# Patient Record
Sex: Male | Born: 1965 | Race: White | Hispanic: No | Marital: Married | Smoking: Current every day smoker
Health system: Southern US, Community
[De-identification: ages and names within clinical notes are randomized; demographics above are authoritative.]

## PROBLEM LIST (undated history)

## (undated) DIAGNOSIS — E119 Type 2 diabetes mellitus without complications: Secondary | ICD-10-CM

## (undated) DIAGNOSIS — G629 Polyneuropathy, unspecified: Secondary | ICD-10-CM

## (undated) DIAGNOSIS — E785 Hyperlipidemia, unspecified: Secondary | ICD-10-CM

## (undated) HISTORY — DX: Polyneuropathy, unspecified: G62.9

## (undated) HISTORY — DX: Type 2 diabetes mellitus without complications: E11.9

## (undated) HISTORY — DX: Hyperlipidemia, unspecified: E78.5

---

## 2017-11-24 ENCOUNTER — Encounter: Payer: Self-pay | Admitting: Emergency Medicine

## 2017-11-24 ENCOUNTER — Emergency Department: Payer: BLUE CROSS/BLUE SHIELD

## 2017-11-24 ENCOUNTER — Other Ambulatory Visit: Payer: Self-pay

## 2017-11-24 DIAGNOSIS — X58XXXA Exposure to other specified factors, initial encounter: Secondary | ICD-10-CM | POA: Diagnosis not present

## 2017-11-24 DIAGNOSIS — M722 Plantar fascial fibromatosis: Secondary | ICD-10-CM | POA: Diagnosis not present

## 2017-11-24 DIAGNOSIS — S93602A Unspecified sprain of left foot, initial encounter: Secondary | ICD-10-CM | POA: Diagnosis not present

## 2017-11-24 DIAGNOSIS — S99922A Unspecified injury of left foot, initial encounter: Secondary | ICD-10-CM | POA: Diagnosis present

## 2017-11-24 DIAGNOSIS — F17200 Nicotine dependence, unspecified, uncomplicated: Secondary | ICD-10-CM | POA: Diagnosis not present

## 2017-11-24 DIAGNOSIS — Y929 Unspecified place or not applicable: Secondary | ICD-10-CM | POA: Insufficient documentation

## 2017-11-24 DIAGNOSIS — Y9389 Activity, other specified: Secondary | ICD-10-CM | POA: Diagnosis not present

## 2017-11-24 DIAGNOSIS — Y990 Civilian activity done for income or pay: Secondary | ICD-10-CM | POA: Diagnosis not present

## 2017-11-24 NOTE — ED Triage Notes (Signed)
Pt to triage via w/c with no distress noted; pt reports left foot slipped this evening and felt "snap" in arch"; cont to have pain

## 2017-11-25 ENCOUNTER — Emergency Department
Admission: EM | Admit: 2017-11-25 | Discharge: 2017-11-25 | Disposition: A | Discharge status: Home / Self Care | Payer: BLUE CROSS/BLUE SHIELD | Attending: Emergency Medicine | Admitting: Emergency Medicine

## 2017-11-25 DIAGNOSIS — M722 Plantar fascial fibromatosis: Secondary | ICD-10-CM

## 2017-11-25 DIAGNOSIS — S93602A Unspecified sprain of left foot, initial encounter: Secondary | ICD-10-CM

## 2017-11-25 MED ORDER — KETOROLAC TROMETHAMINE 60 MG/2ML IM SOLN
60.0000 mg | Freq: Once | INTRAMUSCULAR | Status: AC
  Administered 2017-11-25: 60 mg via INTRAMUSCULAR
  Filled 2017-11-25: qty 2

## 2017-11-25 MED ORDER — ETODOLAC 200 MG PO CAPS: 200.0000 mg | ORAL_CAPSULE | Freq: Three times a day (TID) | ORAL | 0 refills | Status: AC

## 2017-11-25 MED ORDER — TRAMADOL HCL 50 MG PO TABS
50.0000 mg | ORAL_TABLET | Freq: Once | ORAL | Status: AC
  Administered 2017-11-25: 50 mg via ORAL
  Filled 2017-11-25: qty 1

## 2017-11-25 MED ORDER — TRAMADOL HCL 50 MG PO TABS: 50.0000 mg | ORAL_TABLET | Freq: Four times a day (QID) | ORAL | 0 refills | Status: DC | PRN

## 2017-11-25 NOTE — ED Provider Notes (Signed)
[facility] Emergency Department Provider Note   ____________________________________________   First MD Initiated Contact with Patient 11/25/17 [phone]     (approximate)  I have reviewed the triage vital signs and the nursing notes.   HISTORY  Chief Complaint Foot Pain    HPI Devin Morse is a 52 y.o. male who comes into the hospital today with left foot pain.  For the past 3 weeks he thought maybe he had a heel spur.  While he was at work today he put his foot on a step and he felt some tearing to the arch of his foot.  He is concerned that he may have torn tendons.  His foot is throbbing and he does have pain in his heel.  He is unable to put pressure on his foot.  The patient did not take any medicine or ice his foot before he came.  He did put some Biofreeze on it.  The patient rates his pain a 10 out of 10 in intensity.  This occurred around 6 PM at work.  He is here today for evaluation.   History reviewed. No pertinent past medical history.  There are no active problems to display for this patient.   History reviewed. No pertinent surgical history.  Prior to Admission medications   Medication Sig Start Date End Date Taking? Authorizing Provider  etodolac (LODINE) 200 MG capsule Take 1 capsule (200 mg total) by mouth every 8 (eight) hours. 11/25/17   Rebecka ApleyWebster, Alam Guterrez P, MD  traMADol (ULTRAM) 50 MG tablet Take 1 tablet (50 mg total) by mouth every 6 (six) hours as needed. 11/25/17   Rebecka ApleyWebster, Sheryl Towell P, MD    Allergies Penicillins  No family history on file.  Social History Social History   Tobacco Use  . Smoking status: Current Every Day Smoker  . Smokeless tobacco: Never Used  Substance Use Topics  . Alcohol use: Not on file  . Drug use: Not on file    Review of Systems  Constitutional: No fever/chills Eyes: No visual changes. ENT: No sore throat. Cardiovascular: Denies chest pain. Respiratory: Denies shortness of  breath. Gastrointestinal: No abdominal pain.  No nausea, no vomiting.  No diarrhea.  No constipation. Genitourinary: Negative for dysuria. Musculoskeletal: Left foot pain Skin: Negative for rash. Neurological: Negative for headaches, focal weakness or numbness.   ____________________________________________   PHYSICAL EXAM:  VITAL SIGNS: ED Triage Vitals [11/24/17 2336]  Enc Vitals Group     BP (!) 148/82     Pulse Rate (!) 110     Resp 18     Temp 97.8 F (36.6 C)     Temp src      SpO2 100 %     Weight 255 lb (115.7 kg)     Height 5\' 8"  (1.727 m)     Head Circumference      Peak Flow      Pain Score 10     Pain Loc      Pain Edu?      Excl. in GC?     Constitutional: Alert and oriented. Well appearing and in moderate distress. Eyes: Conjunctivae are normal. PERRL. EOMI. Head: Atraumatic. Nose: No congestion/rhinnorhea. Mouth/Throat: Mucous membranes are moist.  Oropharynx non-erythematous. Cardiovascular: Normal rate, regular rhythm. Grossly normal heart sounds.  Good peripheral circulation. Respiratory: Normal respiratory effort.  No retractions. Lungs CTAB. Gastrointestinal: Soft and nontender. No distention.  Positive bowel sounds Musculoskeletal: Tenderness to palpation of left foot and the heel  instep and balls of his foot.  There is some mild soft tissue swelling and pain with dorsiflexion of his foot.  There is no bruising. Neurologic:  Normal speech and language.  Skin:  Skin is warm, dry and intact.  Psychiatric: Mood and affect are normal.   ____________________________________________   LABS (all labs ordered are listed, but only abnormal results are displayed)  Labs Reviewed - No data to display ____________________________________________  EKG  none ____________________________________________  RADIOLOGY  ED MD interpretation: Left foot x-ray: No acute fracture or joint dislocation identified, calcaneal entheseopathy.  Official radiology  report(s): Dg Foot Complete Left  Result Date: 11/24/2017 CLINICAL DATA:  Left foot slipped this evening and felt a snap in the arch. Pain. EXAM: LEFT FOOT - COMPLETE 3+ VIEW COMPARISON:  None. FINDINGS: There is no evidence of fracture or dislocation. The Lisfranc articulation appears congruent without abnormal widening. Calcaneal enthesophytes are seen along the plantar dorsal aspect. There is no evidence of arthropathy or other focal bone abnormality. Soft tissues are unremarkable. IMPRESSION: No acute fracture or joint dislocation is identified. Calcaneal enthesopathy. Should symptoms persist without improvement, consider cross-sectional imaging or follow-up radiographs in 7-10 days to identify a radiographically occult fracture. Electronically Signed   By: Tollie Ethavid  Kwon M.D.   On: 11/24/2017 23:59    ____________________________________________   PROCEDURES  Procedure(s) performed: None  Procedures  Critical Care performed: No  ____________________________________________   INITIAL IMPRESSION / ASSESSMENT AND PLAN / ED COURSE  As part of my medical decision making, I reviewed the following data within the electronic MEDICAL RECORD NUMBER Notes from prior ED visits and [place] Controlled Substance Database   This is a 52 year old male who comes into the hospital today with some left foot pain after feeling some tearing stepping on it at work.  We did send the patient for an x-ray which did not show any fractures.  We did place the patient in a splint.  His sensation in color and motion was intact after the splint was placed.  The patient received a shot of Toradol and some tramadol.  He is improved.  He will be discharged home.      ____________________________________________   FINAL CLINICAL IMPRESSION(S) / ED DIAGNOSES  Final diagnoses:  Sprain of left foot, initial encounter  Plantar fasciitis of left foot     ED Discharge Orders         Ordered    etodolac (LODINE) 200 MG  capsule  Every 8 hours     11/25/17 0203    traMADol (ULTRAM) 50 MG tablet  Every 6 hours PRN     11/25/17 0203           Note:  This document was prepared using Dragon voice recognition software and may include unintentional dictation errors.    Rebecka ApleyWebster, Money Mckeithan P, MD 11/25/17 [phone]

## 2017-11-25 NOTE — Discharge Instructions (Addendum)
Please follow up with orthopedic surgery for further evaluation of your foot pain.

## 2018-01-12 ENCOUNTER — Encounter: Payer: Self-pay | Admitting: Family Medicine

## 2018-01-12 ENCOUNTER — Ambulatory Visit: Payer: Self-pay | Admitting: Family Medicine

## 2018-01-12 VITALS — BP 128/72 | HR 96 | Temp 98.2°F | Resp 18 | Ht 68.0 in | Wt 274.6 lb

## 2018-01-12 DIAGNOSIS — E785 Hyperlipidemia, unspecified: Secondary | ICD-10-CM

## 2018-01-12 DIAGNOSIS — Z794 Long term (current) use of insulin: Secondary | ICD-10-CM

## 2018-01-12 DIAGNOSIS — E11628 Type 2 diabetes mellitus with other skin complications: Secondary | ICD-10-CM

## 2018-01-12 DIAGNOSIS — Z6841 Body Mass Index (BMI) 40.0 and over, adult: Secondary | ICD-10-CM

## 2018-01-12 DIAGNOSIS — E1142 Type 2 diabetes mellitus with diabetic polyneuropathy: Secondary | ICD-10-CM

## 2018-01-12 DIAGNOSIS — E119 Type 2 diabetes mellitus without complications: Secondary | ICD-10-CM | POA: Insufficient documentation

## 2018-01-12 DIAGNOSIS — Z1211 Encounter for screening for malignant neoplasm of colon: Secondary | ICD-10-CM

## 2018-01-12 DIAGNOSIS — Z1212 Encounter for screening for malignant neoplasm of rectum: Secondary | ICD-10-CM

## 2018-01-12 DIAGNOSIS — N493 Fournier gangrene: Secondary | ICD-10-CM

## 2018-01-12 DIAGNOSIS — M722 Plantar fascial fibromatosis: Secondary | ICD-10-CM | POA: Insufficient documentation

## 2018-01-12 MED ORDER — GABAPENTIN 300 MG PO CAPS: ORAL_CAPSULE | ORAL | 0 refills | Status: DC

## 2018-01-12 MED ORDER — SULFAMETHOXAZOLE-TRIMETHOPRIM 800-160 MG PO TABS: 1.0000 | ORAL_TABLET | Freq: Two times a day (BID) | ORAL | 0 refills | Status: AC

## 2018-01-12 MED ORDER — METFORMIN HCL ER 750 MG PO TB24: 1500.0000 mg | ORAL_TABLET | Freq: Every day | ORAL | 1 refills | Status: DC

## 2018-01-12 MED ORDER — ATORVASTATIN CALCIUM 40 MG PO TABS: 40.0000 mg | ORAL_TABLET | Freq: Every day | ORAL | 1 refills | Status: DC

## 2018-01-12 NOTE — Progress Notes (Signed)
Name: Devin Morse   MRN: [id number]    DOB: 09-06-1965   Date:01/12/2018       Progress Note  Subjective  Chief Complaint  Chief Complaint  Patient presents with  . Establish Care  . Diabetes  . Foot Injury    and pain from diabetic neuropathy and previous foot injury  . Back Pain    low back pain    HPI  PT presents to establish care and for the following:  Diabetes mellitus type 2 Checking sugars?  yes How often? Twice daily - fasting in the morning (150-200's), and when he gets home from work (270-300's) Does patient feel additional teaching/training would be helpful?  no  Have they attended Diabetes education classes? no  Trying to limit white bread, white rice, white potatoes, sweets?  no Trying to limit sweetened drinks like iced tea, soft drinks, sports drinks, fruit juices?  Drinks some sodas throughout the week (maybe 5 a week), drinks a lot of water. Checking feet every day/night?  yes; has neuropathy to bilateral feet - taking gabapentin but has been out for well over a month. Last eye exam:  May 2019 - awaiting records Denies: Polyuria, polydipsia, polyphagia, vision changes. Most recent A1C: No results found for: HGBA1C  We will recheck today. Last CMP Results : is due for repeat today No results found for: NA, K, CL, CO2, GLUCOSE, BUN, CREATININE, CALCIUM, PROT, ALBUMIN, AST, ALT, ALKPHOS, BILITOT, GFRNONAA, GFRAA Urine Micro UTD? No   He has never seen endocrinology.  Current Medication Regimen: Novolin N - 25 units twice daily Novolin R - 12 units twice daily. Metformin 1000mg  BID - has been out for several weeks ACEI/ARB: No Statin: Yes Aspirin therapy: No  History of Fournier's Gangrene with current skin breakdown: Had emergency surgery 2 years ago due to gangrene, has had ongoing issues with recurrent skin infections.  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  Typically takes antibiotic when new boils occur, and notes 2 new spots  recently - would like to have anitbiotic if warranted today.  Denies fatigue; fevers/chills, no drainage from the area.  Endorses redness and tenderness.   Chronic low back pain - Has been taking 1000mg  Naproxen BID.  He states this is "the only way I'm able to get out of bed and move".  He notes history of lower back "compression".  He notes that he does not have time to go to a lot of doctor's appointments because his job is too busy.  Discussed pain management referral, but he declines today.  We will check labs today, and follow up in 2 weeks to discuss possible imaging.  LEFT foot injury -he notes injury to his LEFT foot.  He was seen by [facility] ER - given Tramadol, was put in a boot and was told to return in 30 days but never returned. Was told he had sprained foot and plantar fasciitis.  There are no active problems to display for this patient.   No past surgical history on file.  No family history on file.  Social History   Socioeconomic History  . Marital status: Married    Spouse name: Montford Barg  . Number of children: 1  . Years of education: Not on file  . Highest education level: Not on file  Occupational History  . Not on file  Social Needs  . Financial resource strain: Not hard at all  . Food insecurity:    Worry: Never  true    Inability: Never true  . Transportation needs:    Medical: No    Non-medical: No  Tobacco Use  . Smoking status: Current Every Day Smoker  . Smokeless tobacco: Never Used  Substance and Sexual Activity  . Alcohol use: Never    Frequency: Never  . Drug use: Never  . Sexual activity: Yes  Lifestyle  . Physical activity:    Days per week: 0 days    Minutes per session: 0 min  . Stress: Not at all  Relationships  . Social connections:    Talks on phone: More than three times a week    Gets together: More than three times a week    Attends religious service: Never    Active member of club or organization: No    Attends meetings of  clubs or organizations: Never    Relationship status: Married  . Intimate partner violence:    Fear of current or ex partner: No    Emotionally abused: No    Physically abused: No    Forced sexual activity: No  Other Topics Concern  . Not on file  Social History Narrative  . Not on file     Current Outpatient Medications:  .  atorvastatin (LIPITOR) 40 MG tablet, Take 40 mg by mouth daily., Disp: , Rfl:  .  gabapentin (NEURONTIN) 300 MG capsule, Take 300 mg by mouth 3 (three) times daily., Disp: , Rfl:  .  insulin NPH Human (HUMULIN N,NOVOLIN N) 100 UNIT/ML injection, Inject 25 Units into the skin daily before breakfast., Disp: , Rfl:  .  insulin regular (NOVOLIN R,HUMULIN R) 100 units/mL injection, Inject 12 Units into the skin 2 (two) times daily before a meal., Disp: , Rfl:  .  metFORMIN (GLUCOPHAGE) 1000 MG tablet, Take 1,000 mg by mouth 2 (two) times daily with a meal., Disp: , Rfl:  .  etodolac (LODINE) 200 MG capsule, Take 1 capsule (200 mg total) by mouth every 8 (eight) hours. (Patient not taking: Reported on 01/12/2018), Disp: 12 capsule, Rfl: 0 .  traMADol (ULTRAM) 50 MG tablet, Take 1 tablet (50 mg total) by mouth every 6 (six) hours as needed. (Patient not taking: Reported on 01/12/2018), Disp: 12 tablet, Rfl: 0  Allergies  Allergen Reactions  . Penicillins     I personally reviewed active problem list, medication list, allergies, family history, social history, health maintenance, imaging with the patient/caregiver today.   ROS  Constitutional: Negative for fever or weight change.  Respiratory: Negative for cough and shortness of breath.   Cardiovascular: Negative for chest pain or palpitations.  Gastrointestinal: Negative for abdominal pain, no bowel changes.  Musculoskeletal: Negative for gait problem or joint swelling.  Skin: Negative for rash.  Neurological: Negative for dizziness or headache.  No other specific complaints in a complete review of systems (except  as listed in HPI above).  Objective  Vitals:   01/12/18 1316  BP: 128/72  Pulse: 96  Resp: 18  Temp: 98.2 F (36.8 C)  TempSrc: Oral  SpO2: 99%  Weight: 274 lb 9.6 oz (124.6 kg)  Height: 5\' 8"  (1.727 m)   Body mass index is 41.75 kg/m.  Physical Exam  Constitutional: Patient appears well-developed and well-nourished. No distress.  HENT: Head: Normocephalic and atraumatic. Neck: Normal range of motion. Neck supple. No JVD present. No thyromegaly present.  Cardiovascular: Normal rate, regular rhythm and normal heart sounds.  No murmur heard. No BLE edema. Pulmonary/Chest: Effort normal and breath  sounds normal. No respiratory distress. Abdominal: Soft. Bowel sounds are normal, no distension. There is no tenderness. No masses. Musculoskeletal: Normal range of motion, no joint effusions. No gross deformities Neurological: Pt is alert and oriented to person, place, and time. No cranial nerve deficit. Coordination, balance, strength, speech and gait are normal.  Skin: Skin is warm and dry. No rash noted. No erythema. Perianal skin and LEFT buttock have significant scarring, and skin in the perineum is quite taught with a small area that appears to be a tear that is mildly erythematous without exudate, but is tender to palpation.  LEFT buttock has significant deformity - states this is baseline s/p gangrene removal. Psychiatric: Patient has a normal mood and affect. behavior is normal. Judgment and thought content normal.  No results found for this or any previous visit (from the past 72 hour(s)).  PHQ2/9: Depression screen PHQ 2/9 01/12/2018  Decreased Interest 0  Down, Depressed, Hopeless 0  PHQ - 2 Score 0  Altered sleeping 0  Tired, decreased energy 0  Change in appetite 0  Feeling bad or failure about yourself  0  Trouble concentrating 0  Moving slowly or fidgety/restless 0  Suicidal thoughts 0  PHQ-9 Score 0  Difficult doing work/chores Not difficult at all    Fall  Risk: Fall Risk  01/12/2018  Falls in the past year? No   Assessment & Plan  1. Type 2 diabetes mellitus with other skin complication, with long-term current use of insulin (HCC) - We will obtain labs as we have no baseline from which to adjust his medications.  Discussed diabetic diet in detail. - metFORMIN (GLUCOPHAGE XR) 750 MG 24 hr tablet; Take 2 tablets (1,500 mg total) by mouth daily with breakfast.  Dispense: 180 tablet; Refill: 1 - CBC w/Diff/Platelet - COMPLETE METABOLIC PANEL WITH GFR - Urine Microalbumin w/creat. ratio - Hemoglobin A1c  2. Fournier's gangrene of scrotum - Pt appears to have small area of cellulitis - we will treat today for this, and continue to monitor the area.  He has seen plastics several times in the past about the scar tissue, and declines referral today. - sulfamethoxazole-trimethoprim (BACTRIM DS,SEPTRA DS) 800-160 MG tablet; Take 1 tablet by mouth 2 (two) times daily for 7 days.  Dispense: 14 tablet; Refill: 0  3. Diabetic peripheral neuropathy (HCC) - gabapentin (NEURONTIN) 300 MG capsule; Take 1 tablet at night for 5 days, then increase to 1 tablet twice daily for 5 days, then increase to 1 tablet three time a day.  Dispense: 90 capsule; Refill: 0  4. Plantar fasciitis of left foot - Discussed routine care including supportive footwear, stretching, and rolling a frozen waterbottle  5. Hyperlipidemia, unspecified hyperlipidemia type - atorvastatin (LIPITOR) 40 MG tablet; Take 1 tablet (40 mg total) by mouth daily.  Dispense: 90 tablet; Refill: 1 - Lipid panel - Discussed importance of 150 minutes of physical activity weekly, eat two servings of fish weekly, eat one serving of tree nuts ( cashews, pistachios, pecans, almonds.Marland Kitchen) every other day, eat 6 servings of fruit/vegetables daily and drink plenty of water and avoid sweet beverages.   6. Class 3 severe obesity due to excess calories with serious comorbidity and body mass index (BMI) of 40.0 to  44.9 in adult [facility]) Discussed importance of 150 minutes of physical activity weekly, eat two servings of fish weekly, eat one serving of tree nuts ( cashews, pistachios, pecans, almonds.Marland Kitchen) every other day, eat 6 servings of fruit/vegetables daily and drink plenty of  water and avoid sweet beverages.   7. Screening for colorectal cancer - Ambulatory referral to Gastroenterology  I advised patient that he has a significant and complex medical history, therefore close follow up in the beginning as he becomes established with our practice is going to be necessary.  He verbalizes understanding. Return in about 2 weeks (around 01/26/2018) for Follow Up.

## 2018-01-14 LAB — CBC WITH DIFFERENTIAL/PLATELET
BASOS PCT: 0.7 %
Basophils Absolute: 64 cells/uL (ref 0–200)
EOS ABS: 127 {cells}/uL (ref 15–500)
Eosinophils Relative: 1.4 %
HEMATOCRIT: 43 % (ref 38.5–50.0)
Hemoglobin: 14.6 g/dL (ref 13.2–17.1)
LYMPHS ABS: 2248 {cells}/uL (ref 850–3900)
MCH: 29.7 pg (ref 27.0–33.0)
MCHC: 34 g/dL (ref 32.0–36.0)
MCV: 87.6 fL (ref 80.0–100.0)
MPV: 9.6 fL (ref 7.5–12.5)
Monocytes Relative: 5 %
Neutro Abs: 6206 cells/uL (ref 1500–7800)
Neutrophils Relative %: 68.2 %
Platelets: 321 10*3/uL (ref 140–400)
RBC: 4.91 10*6/uL (ref 4.20–5.80)
RDW: 12.9 % (ref 11.0–15.0)
Total Lymphocyte: 24.7 %
WBC: 9.1 10*3/uL (ref 3.8–10.8)
WBCMIX: 455 {cells}/uL (ref 200–950)

## 2018-01-14 LAB — BASIC METABOLIC PANEL WITH GFR
BUN: 12 mg/dL (ref 7–25)
CALCIUM: 9 mg/dL (ref 8.6–10.3)
CHLORIDE: 100 mmol/L (ref 98–110)
CO2: 23 mmol/L (ref 20–32)
Creat: 0.91 mg/dL (ref 0.70–1.33)
GFR, EST AFRICAN AMERICAN: 113 mL/min/{1.73_m2} (ref >=60)
GFR, EST NON AFRICAN AMERICAN: 97 mL/min/{1.73_m2} (ref >=60)
Glucose, Bld: 306 mg/dL — ABNORMAL HIGH (ref 65–99)
Potassium: 4.1 mmol/L (ref 3.5–5.3)
Sodium: 131 mmol/L — ABNORMAL LOW (ref 135–146)

## 2018-01-14 LAB — HEMOGLOBIN A1C
Hgb A1c MFr Bld: 11.4 % of total Hgb — ABNORMAL HIGH (ref <5.7)
Mean Plasma Glucose: 280 (calc)
eAG (mmol/L): 15.5 (calc)

## 2018-01-14 LAB — LIPID PANEL
CHOL/HDL RATIO: 12 (calc) — AB (ref <5.0)
Cholesterol: 287 mg/dL — ABNORMAL HIGH (ref <200)
HDL: 24 mg/dL — ABNORMAL LOW (ref >40)
Non-HDL Cholesterol (Calc): 263 mg/dL (calc) — ABNORMAL HIGH (ref <130)
Triglycerides: 1204 mg/dL — ABNORMAL HIGH (ref <150)

## 2018-01-14 LAB — MICROALBUMIN / CREATININE URINE RATIO
CREATININE, URINE: 64 mg/dL (ref 20–320)
MICROALB UR: 1.1 mg/dL
Microalb Creat Ratio: 17 mcg/mg creat (ref <30)

## 2018-01-15 ENCOUNTER — Telehealth: Payer: Self-pay | Admitting: Emergency Medicine

## 2018-01-15 DIAGNOSIS — E11628 Type 2 diabetes mellitus with other skin complications: Secondary | ICD-10-CM

## 2018-01-15 DIAGNOSIS — E785 Hyperlipidemia, unspecified: Secondary | ICD-10-CM

## 2018-01-15 DIAGNOSIS — Z794 Long term (current) use of insulin: Principal | ICD-10-CM

## 2018-01-15 NOTE — Telephone Encounter (Signed)
Devin Morse asked this patient to come in to see her next week for elevated labs. He ask to start something before then. Please advise

## 2018-01-15 NOTE — Telephone Encounter (Signed)
Copied from CRM [id number]. Topic: General - Other >> Jan 15, 2018  9:25 AM Gerrianne Scale wrote: Reason for CRM: Wife [name] calling back to speak with Myriam Jacobson stating that her husband would like something called into his pharmacy for his cholesterol so that he can start it before appt next Friday [id number] [place],  - [address] AT [facility] [address] [phone] (Phone) [phone] (Fax)

## 2018-01-18 ENCOUNTER — Other Ambulatory Visit: Payer: Self-pay | Admitting: Family Medicine

## 2018-01-18 NOTE — Telephone Encounter (Signed)
We need to discuss his diabetes medications and cholesterol management at his appointment - I am hoping to have the clinical pharmacist, Raynelle Fanning, meet with him as well to help him to better afford his medications as he is self-pay.  Until the appointment, he needs to be taking his insulin as prescribed, monitoring blood sugars, and taking his atorvastatin daily.

## 2018-01-18 NOTE — Telephone Encounter (Signed)
Spoke to wife [name]. He has appointment for 10/18. She was informed to check with him to see if he can come in earlier than Friday at 3:20 for appointment and then to use resources from [facility] since patient has not insurance. Wife will get in touch with him and call back

## 2018-01-18 NOTE — Addendum Note (Signed)
Addended by: Doren Custard on: 01/18/2018 09:11 AM   Modules accepted: Orders

## 2018-01-21 ENCOUNTER — Ambulatory Visit: Payer: Self-pay | Admitting: Family Medicine

## 2018-01-22 ENCOUNTER — Ambulatory Visit: Payer: Self-pay | Admitting: Family Medicine

## 2018-01-22 ENCOUNTER — Encounter: Payer: Self-pay | Admitting: Family Medicine

## 2018-01-22 VITALS — BP 126/62 | HR 97 | Temp 98.2°F | Ht 68.0 in | Wt 277.8 lb

## 2018-01-22 DIAGNOSIS — Z6841 Body Mass Index (BMI) 40.0 and over, adult: Secondary | ICD-10-CM

## 2018-01-22 DIAGNOSIS — E11628 Type 2 diabetes mellitus with other skin complications: Secondary | ICD-10-CM

## 2018-01-22 DIAGNOSIS — E785 Hyperlipidemia, unspecified: Secondary | ICD-10-CM

## 2018-01-22 DIAGNOSIS — Z794 Long term (current) use of insulin: Secondary | ICD-10-CM

## 2018-01-22 DIAGNOSIS — E1142 Type 2 diabetes mellitus with diabetic polyneuropathy: Secondary | ICD-10-CM

## 2018-01-22 LAB — GLUCOSE, POCT (MANUAL RESULT ENTRY): POC Glucose: 85 mg/dl (ref 70–99)

## 2018-01-22 MED ORDER — INSULIN ASPART 100 UNIT/ML ~~LOC~~ SOLN: 0.0000 [IU] | Freq: Three times a day (TID) | SUBCUTANEOUS | 99 refills | Status: AC

## 2018-01-22 MED ORDER — ATORVASTATIN CALCIUM 80 MG PO TABS: 80.0000 mg | ORAL_TABLET | Freq: Every day | ORAL | 1 refills | Status: AC

## 2018-01-22 MED ORDER — AMITRIPTYLINE HCL 25 MG PO TABS: ORAL_TABLET | ORAL | 0 refills | Status: AC

## 2018-01-22 MED ORDER — BASAGLAR KWIKPEN 100 UNIT/ML ~~LOC~~ SOPN: 20.0000 [IU] | PEN_INJECTOR | Freq: Every day | SUBCUTANEOUS | 2 refills | Status: AC

## 2018-01-22 MED ORDER — METFORMIN HCL ER 750 MG PO TB24: 1500.0000 mg | ORAL_TABLET | Freq: Every day | ORAL | 1 refills | Status: AC

## 2018-01-22 NOTE — Progress Notes (Signed)
Name: Devin Morse   MRN: [id number]    DOB: 1965-06-22   Date:01/22/2018       Progress Note  Subjective  Chief Complaint  Chief Complaint  Patient presents with  . Follow-up  . Medication Refill    HPI  Pt presents to follow up on abnormal labs:  Obesity: Since getting the call that his labs were very abnormal, he's been trying to eat healthier - eating more vegetables, lean protein like tuna, eating oatmeal, eating beans as well, baked proteins and stir-fried vegetables.   Hyperlipidemia: Current Medication Regimen: Atorvastatin 40mg  once daily - we will increase to 80mg  daily. Last Lipids: Lab Results  Component Value Date   CHOL 287 (H) 01/13/2018   HDL 24 (L) 01/13/2018   LDLCALC  01/13/2018     Comment:     . LDL cholesterol not calculated. Triglyceride levels greater than 400 mg/dL invalidate calculated LDL results. . Reference range: <100 . Desirable range <100 mg/dL for primary prevention;   <70 mg/dL for patients with CHD or diabetic patients  with > or = 2 CHD risk factors. Marland Kitchen LDL-C is now calculated using the Martin-Hopkins  calculation, which is a validated novel method providing  better accuracy than the Friedewald equation in the  estimation of LDL-C.  Horald Pollen et al. Lenox Ahr. 4098;119(14): 2061-2068  (http://education.[facility].com/faq/FAQ164)    TRIG 1,204 (H) 01/13/2018   CHOLHDL 12.0 (H) 01/13/2018   - Current Diet:Improving his diet significantly - see above - Denies: Chest pain, shortness of breath, myalgias. - Documented aortic atherosclerosis? No - Risk factors for atherosclerosis: diabetes mellitus and hypercholesterolemia  Diabetes mellitus type 2 Checking sugars?  yes How often? 3 times daily. He notes BG's have been coming down since taking metformin regularly. Does patient feel additional teaching/training would be helpful?  no  Have they attended Diabetes education classes? no  Trying to limit white bread, white rice, white  potatoes, sweets?  yes Trying to limit sweetened drinks like iced tea, soft drinks, sports drinks, fruit juices?  yes Checking feet every day/night?  yes Denies: Polyuria, polydipsia, polyphagia, vision changes, or neuropathy. Taking Metformin 750mg  2 tablets in the morning.   We will d/c NPH and Novolin.  Will start Basaglar at 12.5units once daily and sliding scale insulin aspart. He is linked in with CCM team with our clinic - they will work on applying for patient assistance with [facility].  May consider trulicity in the future as well.  Most recent A1C:  Lab Results  Component Value Date   HGBA1C 11.4 (H) 01/13/2018    We will recheck today. Last CMP Results : is not due for repeat today    Component Value Date/Time   NA 131 (L) 01/13/2018 1454   K 4.1 01/13/2018 1454   CL 100 01/13/2018 1454   CO2 23 01/13/2018 1454   GLUCOSE 306 (H) 01/13/2018 1454   BUN 12 01/13/2018 1454   CREATININE 0.91 01/13/2018 1454   CALCIUM 9.0 01/13/2018 1454   GFRNONAA 97 01/13/2018 1454   GFRAA 113 01/13/2018 1454   Urine Micro UTD? Yes Current Medication Management: Diabetic Medications:  ACEI/ARB: No Statin: Yes Aspirin therapy: No  Chronic low back pain - Had been taking 1000mg  Naproxen BID.  He states this is "the only way I'm able to get out of bed and move".  He notes history of lower back "compression".  He notes that he does not have time to go to a lot of doctor's appointments because his  job is too busy.  Discussed pain management referral, but he declines today.   He has ongoing neuropathy in bilateral feet - tried gabapentin but it did not help - he requests amitriptyline - advised we can stop gabapentin (he has not taken in a few days) and trial amitriptyline.   Patient Active Problem List   Diagnosis Date Noted  . Fournier's gangrene of scrotum 01/12/2018  . Plantar fasciitis of left foot 01/12/2018  . Diabetic peripheral neuropathy (HCC) 01/12/2018  . Diabetes mellitus (HCC)  01/12/2018  . Class 3 severe obesity due to excess calories with serious comorbidity and body mass index (BMI) of 40.0 to 44.9 in adult (HCC) 01/12/2018  . Hyperlipidemia 01/12/2018    History reviewed. No pertinent surgical history.  History reviewed. No pertinent family history.  Social History   Socioeconomic History  . Marital status: Married    Spouse name: Chinedu Agustin  . Number of children: 1  . Years of education: Not on file  . Highest education level: Not on file  Occupational History  . Not on file  Social Needs  . Financial resource strain: Not hard at all  . Food insecurity:    Worry: Never true    Inability: Never true  . Transportation needs:    Medical: No    Non-medical: No  Tobacco Use  . Smoking status: Current Every Day Smoker  . Smokeless tobacco: Never Used  Substance and Sexual Activity  . Alcohol use: Never    Frequency: Never  . Drug use: Never  . Sexual activity: Yes  Lifestyle  . Physical activity:    Days per week: 0 days    Minutes per session: 0 min  . Stress: Not at all  Relationships  . Social connections:    Talks on phone: More than three times a week    Gets together: More than three times a week    Attends religious service: Never    Active member of club or organization: No    Attends meetings of clubs or organizations: Never    Relationship status: Married  . Intimate partner violence:    Fear of current or ex partner: No    Emotionally abused: No    Physically abused: No    Forced sexual activity: No  Other Topics Concern  . Not on file  Social History Narrative  . Not on file     Current Outpatient Medications:  .  metFORMIN (GLUCOPHAGE XR) 750 MG 24 hr tablet, Take 2 tablets (1,500 mg total) by mouth daily with breakfast., Disp: 180 tablet, Rfl: 1 .  amitriptyline (ELAVIL) 25 MG tablet, Take 1 tablet every night before bed for 7 days; then increase to 2 tablets every night before bed., Disp: 180 tablet, Rfl: 0 .   atorvastatin (LIPITOR) 80 MG tablet, Take 1 tablet (80 mg total) by mouth daily., Disp: 90 tablet, Rfl: 1 .  etodolac (LODINE) 200 MG capsule, Take 1 capsule (200 mg total) by mouth every 8 (eight) hours. (Patient not taking: Reported on 01/12/2018), Disp: 12 capsule, Rfl: 0 .  insulin aspart (NOVOLOG) 100 UNIT/ML injection, Inject 0-10 Units into the skin 3 (three) times daily before meals. Insulin Novolog:  Check fsbs prior to meals  If glucose below 140 give 4 units If glucose 140 to 180 give 6 units  If glucose 180 to 220  give 8 units If glucose above 220 give 10 units, Disp: 3 vial, Rfl: PRN .  Insulin Glargine (BASAGLAR  KWIKPEN) 100 UNIT/ML SOPN, Inject 0.2 mLs (20 Units total) into the skin daily., Disp: 1 pen, Rfl: 2  Allergies  Allergen Reactions  . Penicillins     I personally reviewed active problem list, medication list, allergies, lab results with the patient/caregiver today.   ROS Ten systems reviewed and is negative except as mentioned in HPI  Objective  Vitals:   01/22/18 1501  BP: 126/62  Pulse: 97  Temp: 98.2 F (36.8 C)  SpO2: 95%  Weight: 277 lb 12.8 oz (126 kg)  Height: 5\' 8"  (1.727 m)    Body mass index is 42.24 kg/m.  Physical Exam Constitutional: Patient appears well-developed and well-nourished. No distress.  HENT: Head: Normocephalic and atraumatic.  Cardiovascular: Normal rate, regular rhythm and normal heart sounds.  No murmur heard. No BLE edema. Pulmonary/Chest: Effort normal and breath sounds normal. No respiratory distress. Musculoskeletal: Normal range of motion, no joint effusions. No gross deformities Neurological: Pt is alert and oriented to person, place, and time. No cranial nerve deficit. Coordination, balance, strength, speech and gait are normal.  Skin: Skin is warm and dry. No rash noted. No erythema.  Psychiatric: Patient has a normal mood and affect. behavior is normal. Judgment and thought content normal.  Results for orders  placed or performed in visit on 01/22/18 (from the past 72 hour(s))  POCT glucose (manual entry)     Status: Normal   Collection Time: 01/22/18  4:08 PM  Result Value Ref Range   POC Glucose 85 70 - 99 mg/dl    WUJ8/1: Depression screen [facility] 2/9 01/22/2018 01/12/2018  Decreased Interest 0 0  Down, Depressed, Hopeless 0 0  PHQ - 2 Score 0 0  Altered sleeping 3 0  Tired, decreased energy 3 0  Change in appetite 0 0  Feeling bad or failure about yourself  0 0  Trouble concentrating 0 0  Moving slowly or fidgety/restless 0 0  Suicidal thoughts 0 0  PHQ-9 Score 6 0  Difficult doing work/chores Not difficult at all Not difficult at all   Fall Risk: Fall Risk  01/22/2018 01/12/2018  Falls in the past year? No No   Functional Status Survey: Is the patient deaf or have difficulty hearing?: No Does the patient have difficulty seeing, even when wearing glasses/contacts?: No Does the patient have difficulty concentrating, remembering, or making decisions?: No Does the patient have difficulty walking or climbing stairs?: No Does the patient have difficulty dressing or bathing?: No Does the patient have difficulty doing errands alone such as visiting a doctor's office or shopping?: No  Assessment & Plan  1. Hyperlipidemia, unspecified hyperlipidemia type - He is changing his diet, will discuss further with CCM.  Increase atorvastatin to 80mg  daily. - atorvastatin (LIPITOR) 80 MG tablet; Take 1 tablet (80 mg total) by mouth daily.  Dispense: 90 tablet; Refill: 1  2. Type 2 diabetes mellitus with other skin complication, with long-term current use of insulin (HCC) - Lengthy discussion regarding medication; CCM team to work on patient assistance with [place] - may consider adding trulicity in the future.  - POCT glucose (manual entry) - Insulin Glargine (BASAGLAR KWIKPEN) 100 UNIT/ML SOPN; Inject 0.2 mLs (20 Units total) into the skin daily.  Dispense: 1 pen; Refill: 2 - insulin aspart (NOVOLOG)  100 UNIT/ML injection; Inject 0-10 Units into the skin 3 (three) times daily before meals. Insulin Novolog:  Check fsbs prior to meals  If glucose below 140 give 4 units If glucose 140 to 180 give  6 units  If glucose 180 to 220  give 8 units If glucose above 220 give 10 units  Dispense: 3 vial; Refill: PRN - amitriptyline (ELAVIL) 25 MG tablet; Take 1 tablet every night before bed for 7 days; then increase to 2 tablets every night before bed.  Dispense: 180 tablet; Refill: 0 - metFORMIN (GLUCOPHAGE XR) 750 MG 24 hr tablet; Take 2 tablets (1,500 mg total) by mouth daily with breakfast.  Dispense: 180 tablet; Refill: 1  3. Class 3 severe obesity due to excess calories with serious comorbidity and body mass index (BMI) of 40.0 to 44.9 in adult [facility]) - Discussed importance of 150 minutes of physical activity weekly, eat two servings of fish weekly, eat one serving of tree nuts ( cashews, pistachios, pecans, almonds.Marland Kitchen) every other day, eat 6 servings of fruit/vegetables daily and drink plenty of water and avoid sweet beverages.   4. Diabetic peripheral neuropathy (HCC) - amitriptyline (ELAVIL) 25 MG tablet; Take 1 tablet every night before bed for 7 days; then increase to 2 tablets every night before bed.  Dispense: 180 tablet; Refill: 0   Discussed case in detail with Dr. Carlynn Purl who is in agreement with this plan of care.  Discussed case with Serina Cowper RN and Raynelle Fanning PharmD. They will be in contact early next week with the patient.

## 2018-01-22 NOTE — Patient Instructions (Addendum)
-   STOP NPH Insulin; start Basaglar 12 units once daily.  - Continue your Meal Time insulin based on your blood sugars: I have sent in Novolog Insulin to replace your current meal time insulin, please wait to fill this until Devin Morse with our Chronic Care Management Team calls you next week. Insulin Novolog:  Check fsbs prior to meals  If glucose below 140 do not take any insulin If glucose 140 to 160 give 4 units If glucose 161 to 180 give 6 units  If glucose 181 to 220  give 8 units If glucose above 221 give 10 units  Atorvastatin - take 2 tablets of the 40mg  until you are out, then start atking 1 tablet daily of the 80mg .

## 2018-01-26 ENCOUNTER — Ambulatory Visit: Payer: Self-pay | Admitting: Family Medicine

## 2018-01-26 ENCOUNTER — Ambulatory Visit: Payer: Self-pay | Admitting: Pharmacist

## 2018-01-26 DIAGNOSIS — Z6841 Body Mass Index (BMI) 40.0 and over, adult: Secondary | ICD-10-CM

## 2018-01-26 DIAGNOSIS — E11628 Type 2 diabetes mellitus with other skin complications: Secondary | ICD-10-CM

## 2018-01-26 DIAGNOSIS — Z794 Long term (current) use of insulin: Principal | ICD-10-CM

## 2018-01-26 NOTE — Chronic Care Management (AMB) (Signed)
  Care Management   Note  01/26/2018 Name: Juwann Sherk MRN: [id number] DOB: 03-12-1966  Reason for referral: Diabetes medication management Referral source: Maurice Small Last office visit: 01/22/18  Unsuccessful telephone call attempt #1 to patient.   Message left with patient's wife requesting a return call from patient  Plan:  I will make another outreach attempt to patient within 3-4 business days  Karalee Height, PharmD Clinical Pharmacist [facility] [employer] [phone]

## 2018-01-27 ENCOUNTER — Encounter: Payer: Self-pay | Admitting: *Deleted

## 2018-02-04 ENCOUNTER — Ambulatory Visit: Payer: Self-pay | Admitting: Pharmacist

## 2018-02-04 DIAGNOSIS — Z6841 Body Mass Index (BMI) 40.0 and over, adult: Secondary | ICD-10-CM

## 2018-02-04 DIAGNOSIS — E11628 Type 2 diabetes mellitus with other skin complications: Secondary | ICD-10-CM

## 2018-02-04 DIAGNOSIS — Z794 Long term (current) use of insulin: Principal | ICD-10-CM

## 2018-02-04 NOTE — Patient Instructions (Signed)
Please contact the Care Management team if you have any questions or needs in the future!   Please call a member of the CCM (Chronic Care Management) Team with any questions or case management needs:   Arthur Holms, BSN Nurse Care Coordinator  [phone]  Karalee Height, PharmD  Clinical Pharmacist  [phone]

## 2018-02-04 NOTE — Chronic Care Management (AMB) (Signed)
  Care Management   Note  02/04/2018 Name: Devin Morse MRN: [id number] DOB: 1965/05/05  Reason for referral: Diabetes medication management Referral source: Maurice Small Last office visit: 01/22/18  Unsuccessful telephone call attempt #2 to patient. I spoke with patient's wife, [name], who stated that the patient was doing very well. He didn't think that the insulins [name] recently prescribed worked at all so he has gone back to using NPH and Novolin. [name] states that they have drastically modified their diet also and that everything was "going good" and they didn't need care management assistance at this time.   I provided care management contact information should [name] have any needs in the future. I will route my note to Maurice Small so she will be aware of the changes [name] has made as well as his decline of CM.   Karalee Height, PharmD Clinical Pharmacist [facility] [phone]

## 2018-04-30 ENCOUNTER — Ambulatory Visit: Payer: Self-pay | Admitting: Family Medicine

## 2020-01-15 IMAGING — CR DG FOOT COMPLETE 3+V*L*
1 series · 3 of 3 positions shown · non-contrast
Comparison: None.

CLINICAL DATA: Left foot slipped this evening and felt a snap in
the arch. Pain.

EXAM:
LEFT FOOT - COMPLETE 3+ VIEW

[Series 1: dg foot complete left · 0.14mm/px · 3 of 3 slices shown]
[im 1/3]
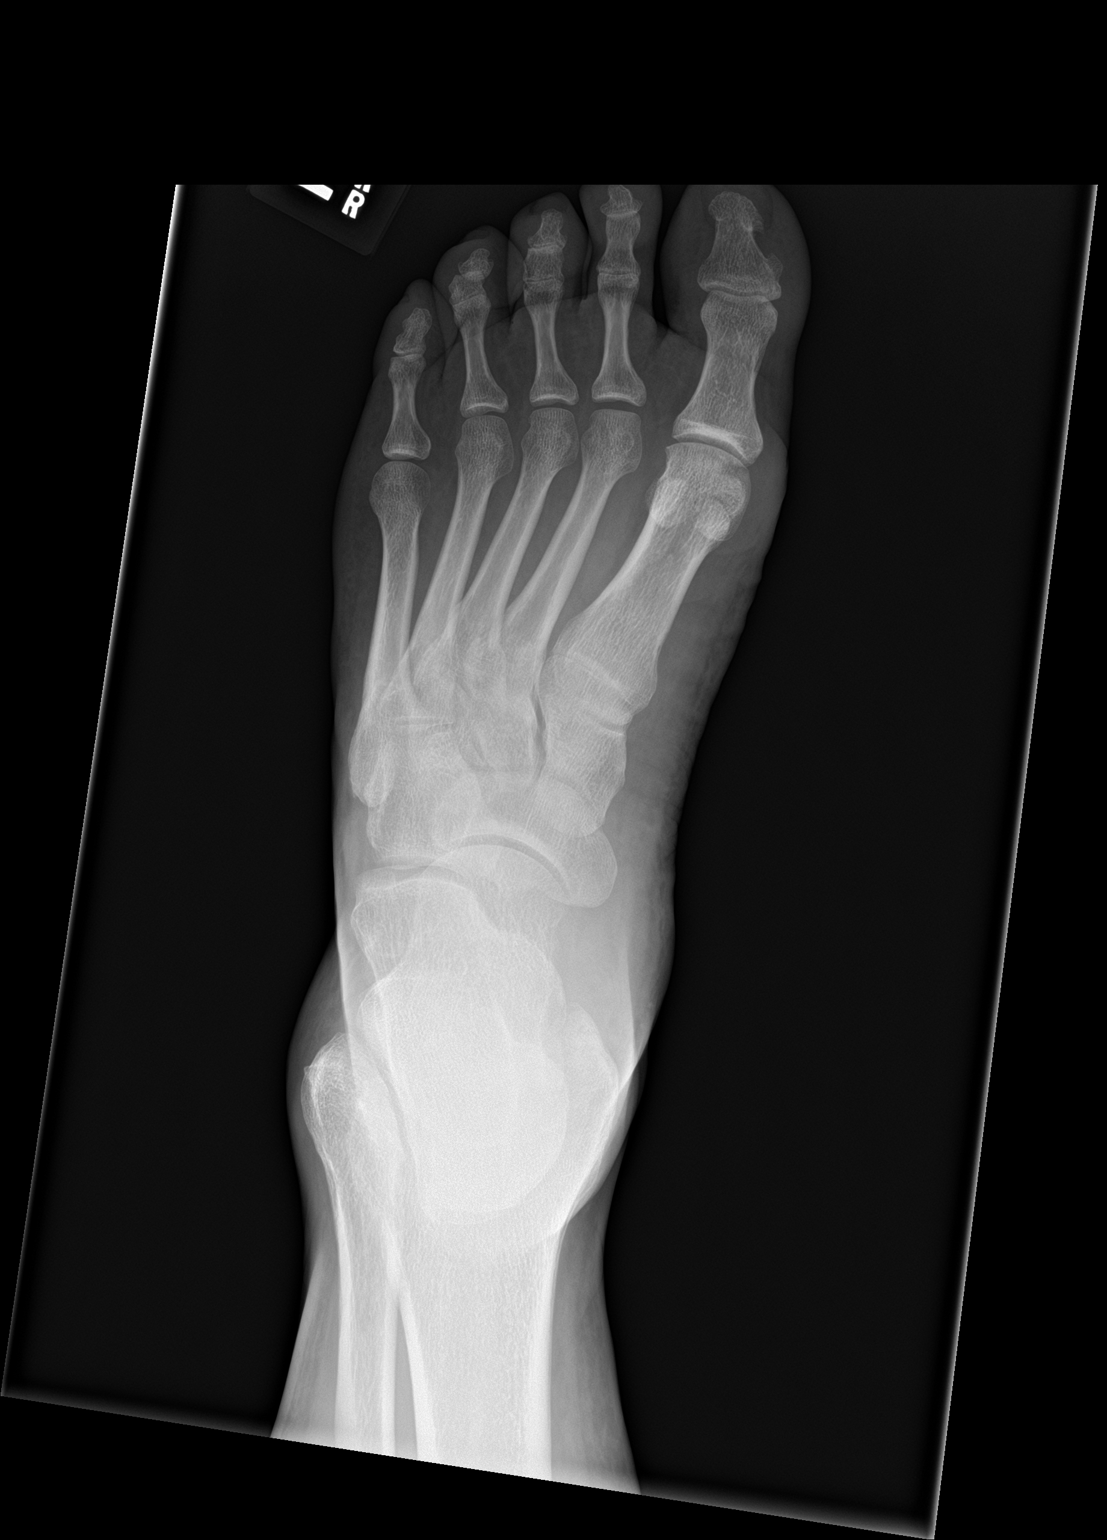
[im 2/3]
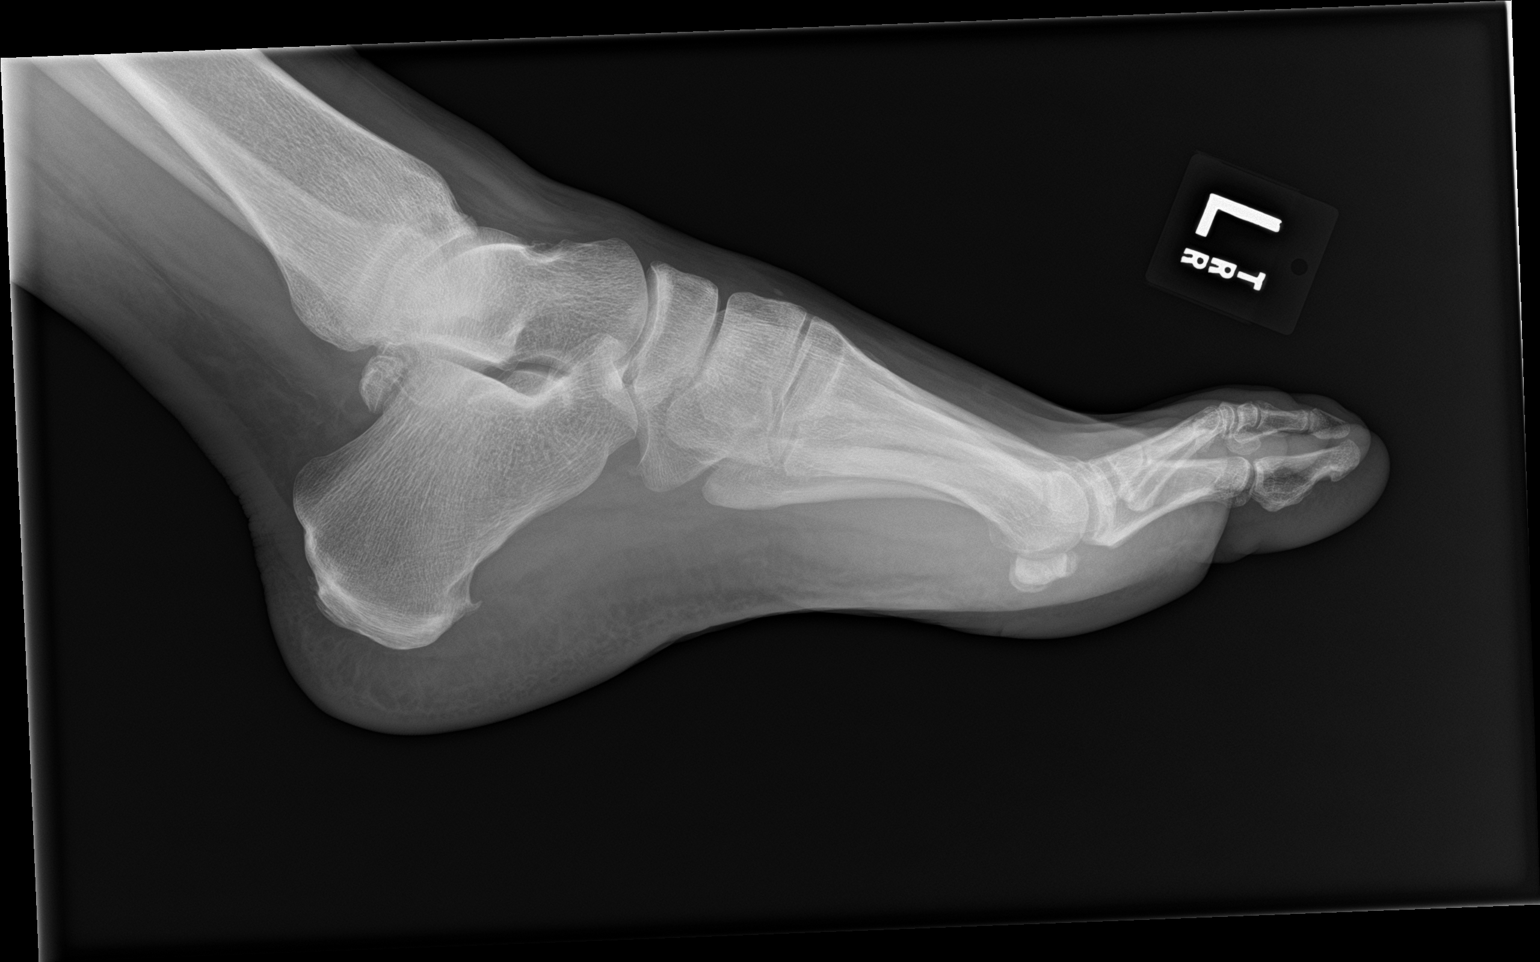
[im 3/3]
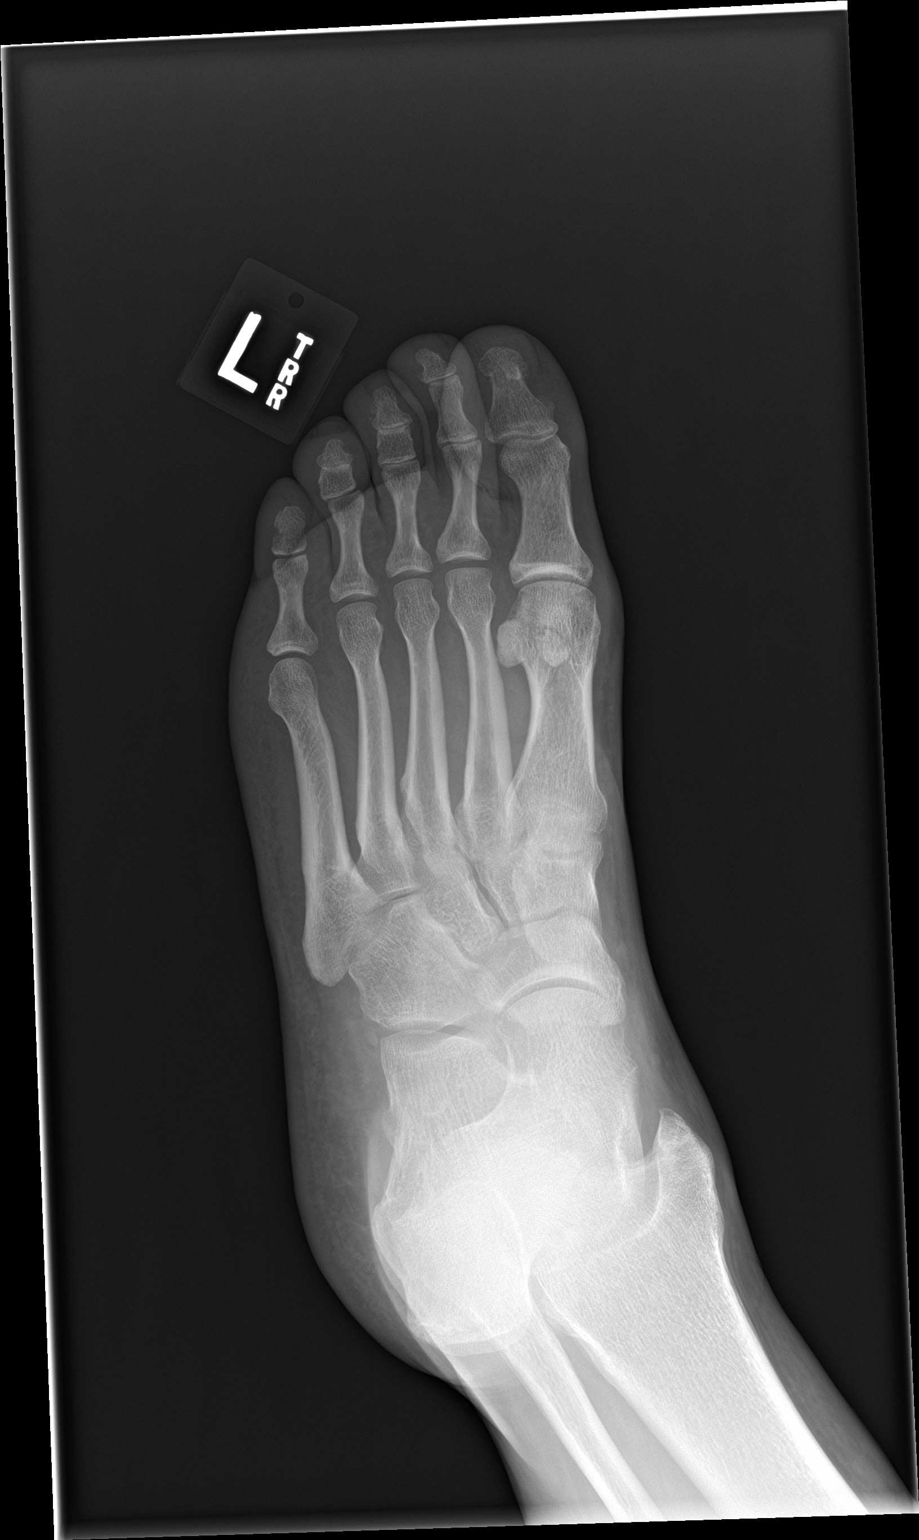

[3 of 3 positions shown; findings below may reference images not displayed]

FINDINGS: There is no evidence of fracture or dislocation. The Lisfranc
articulation appears congruent without abnormal widening. Calcaneal
enthesophytes are seen along the plantar dorsal aspect. There is no
evidence of arthropathy or other focal bone abnormality. Soft
tissues are unremarkable.
IMPRESSION: No acute fracture or joint dislocation is identified. Calcaneal
enthesopathy. Should symptoms persist without improvement, consider
cross-sectional imaging or follow-up radiographs in 7-10 days to
identify a radiographically occult fracture.
